# Patient Record
Sex: Female | Born: 1960 | Race: White | Hispanic: No | Marital: Married
Health system: Southern US, Community
[De-identification: ages and names within clinical notes are randomized; demographics above are authoritative.]

---

## 1997-10-28 ENCOUNTER — Ambulatory Visit (HOSPITAL_COMMUNITY): Admission: RE | Admit: 1997-10-28 | Discharge: 1997-10-28 | Payer: Self-pay | Admitting: Obstetrics & Gynecology

## 1999-12-11 ENCOUNTER — Other Ambulatory Visit: Admission: RE | Admit: 1999-12-11 | Discharge: 1999-12-11 | Payer: Self-pay | Admitting: Obstetrics & Gynecology

## 2000-09-29 ENCOUNTER — Encounter: Payer: Self-pay | Admitting: Emergency Medicine

## 2000-09-29 ENCOUNTER — Emergency Department (HOSPITAL_COMMUNITY): Admission: EM | Admit: 2000-09-29 | Discharge: 2000-09-30 | Payer: Self-pay | Admitting: Internal Medicine

## 2000-09-30 ENCOUNTER — Ambulatory Visit (HOSPITAL_BASED_OUTPATIENT_CLINIC_OR_DEPARTMENT_OTHER): Admission: RE | Admit: 2000-09-30 | Discharge: 2000-09-30 | Payer: Self-pay | Admitting: Orthopedic Surgery

## 2001-05-26 ENCOUNTER — Other Ambulatory Visit: Admission: RE | Admit: 2001-05-26 | Discharge: 2001-05-26 | Payer: Self-pay | Admitting: Obstetrics & Gynecology

## 2002-01-01 ENCOUNTER — Encounter: Payer: Self-pay | Admitting: Obstetrics & Gynecology

## 2002-01-01 ENCOUNTER — Encounter: Admission: RE | Admit: 2002-01-01 | Discharge: 2002-01-01 | Payer: Self-pay | Admitting: Obstetrics & Gynecology

## 2002-07-16 ENCOUNTER — Other Ambulatory Visit: Admission: RE | Admit: 2002-07-16 | Discharge: 2002-07-16 | Payer: Self-pay | Admitting: Obstetrics & Gynecology

## 2003-05-06 ENCOUNTER — Encounter: Admission: RE | Admit: 2003-05-06 | Discharge: 2003-05-06 | Payer: Self-pay | Admitting: Family Medicine

## 2004-02-24 ENCOUNTER — Other Ambulatory Visit: Admission: RE | Admit: 2004-02-24 | Discharge: 2004-02-24 | Payer: Self-pay | Admitting: Obstetrics & Gynecology

## 2004-04-22 HISTORY — PX: BREAST BIOPSY: SHX20

## 2004-05-25 ENCOUNTER — Encounter: Admission: RE | Admit: 2004-05-25 | Discharge: 2004-05-25 | Payer: Self-pay | Admitting: Obstetrics & Gynecology

## 2004-06-15 ENCOUNTER — Encounter: Admission: RE | Admit: 2004-06-15 | Discharge: 2004-06-15 | Payer: Self-pay | Admitting: Obstetrics & Gynecology

## 2004-06-20 ENCOUNTER — Encounter: Admission: RE | Admit: 2004-06-20 | Discharge: 2004-06-20 | Payer: Self-pay | Admitting: Obstetrics & Gynecology

## 2004-06-22 ENCOUNTER — Encounter: Admission: RE | Admit: 2004-06-22 | Discharge: 2004-06-22 | Payer: Self-pay | Admitting: Obstetrics & Gynecology

## 2004-07-06 ENCOUNTER — Encounter: Admission: RE | Admit: 2004-07-06 | Discharge: 2004-07-06 | Payer: Self-pay | Admitting: Obstetrics & Gynecology

## 2004-07-06 ENCOUNTER — Encounter (INDEPENDENT_AMBULATORY_CARE_PROVIDER_SITE_OTHER): Payer: Self-pay | Admitting: Specialist

## 2005-01-15 ENCOUNTER — Encounter: Admission: RE | Admit: 2005-01-15 | Discharge: 2005-01-15 | Payer: Self-pay | Admitting: Obstetrics & Gynecology

## 2005-08-16 ENCOUNTER — Encounter: Admission: RE | Admit: 2005-08-16 | Discharge: 2005-08-16 | Payer: Self-pay | Admitting: Obstetrics & Gynecology

## 2006-08-22 ENCOUNTER — Encounter: Admission: RE | Admit: 2006-08-22 | Discharge: 2006-08-22 | Payer: Self-pay | Admitting: Specialist

## 2007-10-27 ENCOUNTER — Encounter: Admission: RE | Admit: 2007-10-27 | Discharge: 2007-10-27 | Payer: Self-pay | Admitting: Obstetrics & Gynecology

## 2009-01-13 ENCOUNTER — Encounter: Admission: RE | Admit: 2009-01-13 | Discharge: 2009-01-13 | Payer: Self-pay | Admitting: Obstetrics & Gynecology

## 2010-02-05 ENCOUNTER — Encounter: Admission: RE | Admit: 2010-02-05 | Discharge: 2010-02-05 | Payer: Self-pay | Admitting: Obstetrics & Gynecology
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
## 2011-02-05 ENCOUNTER — Other Ambulatory Visit: Payer: Self-pay | Admitting: Obstetrics & Gynecology

## 2011-02-05 DIAGNOSIS — Z1231 Encounter for screening mammogram for malignant neoplasm of breast: Secondary | ICD-10-CM

## 2011-02-22 ENCOUNTER — Ambulatory Visit: Payer: Self-pay

## 2011-03-18 ENCOUNTER — Ambulatory Visit
Admission: RE | Admit: 2011-03-18 | Discharge: 2011-03-18 | Disposition: A | Discharge status: Home / Self Care | Payer: 59 | Source: Ambulatory Visit | Attending: Obstetrics & Gynecology | Admitting: Obstetrics & Gynecology

## 2011-03-18 DIAGNOSIS — Z1231 Encounter for screening mammogram for malignant neoplasm of breast: Secondary | ICD-10-CM

## 2012-02-18 ENCOUNTER — Other Ambulatory Visit: Payer: Self-pay | Admitting: Obstetrics & Gynecology

## 2012-02-18 DIAGNOSIS — Z1231 Encounter for screening mammogram for malignant neoplasm of breast: Secondary | ICD-10-CM

## 2012-03-30 ENCOUNTER — Ambulatory Visit
Admission: RE | Admit: 2012-03-30 | Discharge: 2012-03-30 | Disposition: A | Discharge status: Home / Self Care | Payer: 59 | Source: Ambulatory Visit | Attending: Obstetrics & Gynecology | Admitting: Obstetrics & Gynecology

## 2012-03-30 DIAGNOSIS — Z1231 Encounter for screening mammogram for malignant neoplasm of breast: Secondary | ICD-10-CM

## 2013-06-15 ENCOUNTER — Other Ambulatory Visit: Payer: Self-pay

## 2013-06-15 DIAGNOSIS — Z1231 Encounter for screening mammogram for malignant neoplasm of breast: Secondary | ICD-10-CM

## 2013-06-18 ENCOUNTER — Ambulatory Visit: Payer: 59

## 2013-08-02 ENCOUNTER — Ambulatory Visit
Admission: RE | Admit: 2013-08-02 | Discharge: 2013-08-02 | Disposition: A | Discharge status: Home / Self Care | Payer: 59 | Source: Ambulatory Visit

## 2013-08-02 DIAGNOSIS — Z1231 Encounter for screening mammogram for malignant neoplasm of breast: Secondary | ICD-10-CM

## 2014-01-05 ENCOUNTER — Other Ambulatory Visit: Payer: Self-pay | Admitting: Obstetrics & Gynecology

## 2014-01-06 LAB — CYTOLOGY - PAP

## 2015-09-27 ENCOUNTER — Other Ambulatory Visit: Payer: Self-pay | Admitting: Obstetrics & Gynecology

## 2015-09-27 DIAGNOSIS — Z1231 Encounter for screening mammogram for malignant neoplasm of breast: Secondary | ICD-10-CM

## 2015-10-09 ENCOUNTER — Ambulatory Visit
Admission: RE | Admit: 2015-10-09 | Discharge: 2015-10-09 | Disposition: A | Discharge status: Home / Self Care | Payer: 59 | Source: Ambulatory Visit | Attending: Obstetrics & Gynecology | Admitting: Obstetrics & Gynecology

## 2015-10-09 DIAGNOSIS — Z1231 Encounter for screening mammogram for malignant neoplasm of breast: Secondary | ICD-10-CM

## 2016-09-17 DIAGNOSIS — J0140 Acute pansinusitis, unspecified: Secondary | ICD-10-CM | POA: Diagnosis not present

## 2016-09-17 DIAGNOSIS — R05 Cough: Secondary | ICD-10-CM | POA: Diagnosis not present

## 2017-02-05 ENCOUNTER — Ambulatory Visit
Admission: RE | Admit: 2017-02-05 | Discharge: 2017-02-05 | Disposition: A | Discharge status: Home / Self Care | Payer: 59 | Source: Ambulatory Visit | Attending: Obstetrics & Gynecology | Admitting: Obstetrics & Gynecology

## 2017-02-05 ENCOUNTER — Other Ambulatory Visit: Payer: Self-pay | Admitting: Obstetrics & Gynecology

## 2017-02-05 DIAGNOSIS — Z1231 Encounter for screening mammogram for malignant neoplasm of breast: Secondary | ICD-10-CM

## 2017-02-12 DIAGNOSIS — Z01419 Encounter for gynecological examination (general) (routine) without abnormal findings: Secondary | ICD-10-CM | POA: Diagnosis not present

## 2017-07-09 DIAGNOSIS — K219 Gastro-esophageal reflux disease without esophagitis: Secondary | ICD-10-CM | POA: Diagnosis not present

## 2017-07-09 DIAGNOSIS — Z0000 Encounter for general adult medical examination without abnormal findings: Secondary | ICD-10-CM | POA: Diagnosis not present

## 2017-07-09 DIAGNOSIS — Z23 Encounter for immunization: Secondary | ICD-10-CM | POA: Diagnosis not present

## 2017-10-20 ENCOUNTER — Emergency Department (HOSPITAL_COMMUNITY)
Admission: EM | Admit: 2017-10-20 | Discharge: 2017-10-20 | Disposition: A | Discharge status: Home / Self Care | Payer: 59 | Attending: Emergency Medicine | Admitting: Emergency Medicine

## 2017-10-20 ENCOUNTER — Other Ambulatory Visit: Payer: Self-pay

## 2017-10-20 ENCOUNTER — Encounter (HOSPITAL_COMMUNITY): Payer: Self-pay

## 2017-10-20 ENCOUNTER — Emergency Department (HOSPITAL_COMMUNITY): Payer: 59

## 2017-10-20 DIAGNOSIS — K529 Noninfective gastroenteritis and colitis, unspecified: Secondary | ICD-10-CM | POA: Insufficient documentation

## 2017-10-20 DIAGNOSIS — K279 Peptic ulcer, site unspecified, unspecified as acute or chronic, without hemorrhage or perforation: Secondary | ICD-10-CM | POA: Insufficient documentation

## 2017-10-20 DIAGNOSIS — R109 Unspecified abdominal pain: Secondary | ICD-10-CM | POA: Diagnosis not present

## 2017-10-20 DIAGNOSIS — R1084 Generalized abdominal pain: Secondary | ICD-10-CM | POA: Diagnosis not present

## 2017-10-20 LAB — COMPREHENSIVE METABOLIC PANEL
ALBUMIN: 4.2 g/dL (ref 3.5–5.0)
ALK PHOS: 89 U/L (ref 38–126)
ALT: 35 U/L (ref 0–44)
ANION GAP: 8 (ref 5–15)
AST: 27 U/L (ref 15–41)
BILIRUBIN TOTAL: 0.7 mg/dL (ref 0.3–1.2)
BUN: 15 mg/dL (ref 6–20)
CALCIUM: 9.5 mg/dL (ref 8.9–10.3)
CO2: 27 mmol/L (ref 22–32)
Chloride: 106 mmol/L (ref 98–111)
Creatinine, Ser: 0.82 mg/dL (ref 0.44–1.00)
GFR calc non Af Amer: >60 mL/min (ref >60)
GLUCOSE: 142 mg/dL — AB (ref 70–99)
POTASSIUM: 4.4 mmol/L (ref 3.5–5.1)
Sodium: 141 mmol/L (ref 135–145)
TOTAL PROTEIN: 7.5 g/dL (ref 6.5–8.1)

## 2017-10-20 LAB — CBC
HCT: 44.9 % (ref 36.0–46.0)
HEMOGLOBIN: 14.8 g/dL (ref 12.0–15.0)
MCH: 30.5 pg (ref 26.0–34.0)
MCHC: 33 g/dL (ref 30.0–36.0)
MCV: 92.6 fL (ref 78.0–100.0)
Platelets: 341 10*3/uL (ref 150–400)
RBC: 4.85 MIL/uL (ref 3.87–5.11)
RDW: 12.6 % (ref 11.5–15.5)
WBC: 10.5 10*3/uL (ref 4.0–10.5)

## 2017-10-20 LAB — URINALYSIS, ROUTINE W REFLEX MICROSCOPIC
BILIRUBIN URINE: NEGATIVE
Bacteria, UA: NONE SEEN
Glucose, UA: NEGATIVE mg/dL
HGB URINE DIPSTICK: NEGATIVE
Ketones, ur: NEGATIVE mg/dL
NITRITE: NEGATIVE
PH: 6 (ref 5.0–8.0)
Protein, ur: NEGATIVE mg/dL
SPECIFIC GRAVITY, URINE: 1.019 (ref 1.005–1.030)

## 2017-10-20 LAB — I-STAT BETA HCG BLOOD, ED (MC, WL, AP ONLY): HCG, QUANTITATIVE: 5.7 m[IU]/mL — AB (ref <5)

## 2017-10-20 LAB — LIPASE, BLOOD: Lipase: 34 U/L (ref 11–51)

## 2017-10-20 MED ORDER — SODIUM CHLORIDE 0.9 % IV BOLUS
1000.0000 mL | Freq: Once | INTRAVENOUS | Status: AC
  Administered 2017-10-20: 1000 mL via INTRAVENOUS

## 2017-10-20 MED ORDER — GI COCKTAIL ~~LOC~~
30.0000 mL | Freq: Once | ORAL | Status: AC
Start: 2017-10-20 — End: 2017-10-20
  Administered 2017-10-20: 30 mL via ORAL
  Filled 2017-10-20: qty 30

## 2017-10-20 MED ORDER — ONDANSETRON 4 MG PO TBDP
4.0000 mg | ORAL_TABLET | Freq: Once | ORAL | Status: AC
  Administered 2017-10-20: 4 mg via ORAL
  Filled 2017-10-20: qty 1

## 2017-10-20 MED ORDER — HYDROCODONE-ACETAMINOPHEN 5-325 MG PO TABS
1.0000 | ORAL_TABLET | Freq: Once | ORAL | Status: AC
  Administered 2017-10-20: 1 via ORAL
  Filled 2017-10-20: qty 1

## 2017-10-20 MED ORDER — ACETAMINOPHEN ER 650 MG PO TBCR: 650.0000 mg | EXTENDED_RELEASE_TABLET | Freq: Three times a day (TID) | ORAL | 0 refills | Status: AC | PRN

## 2017-10-20 MED ORDER — HYDROMORPHONE HCL 1 MG/ML IJ SOLN
1.0000 mg | Freq: Once | INTRAMUSCULAR | Status: AC
  Administered 2017-10-20: 1 mg via INTRAVENOUS
  Filled 2017-10-20: qty 1

## 2017-10-20 MED ORDER — MORPHINE SULFATE (PF) 4 MG/ML IV SOLN
4.0000 mg | Freq: Once | INTRAVENOUS | Status: AC
  Administered 2017-10-20: 4 mg via INTRAVENOUS
  Filled 2017-10-20: qty 1

## 2017-10-20 MED ORDER — SUCRALFATE 1 GM/10ML PO SUSP: 1.0000 g | Freq: Three times a day (TID) | ORAL | 0 refills | Status: AC

## 2017-10-20 MED ORDER — ONDANSETRON HCL 4 MG/2ML IJ SOLN
4.0000 mg | Freq: Once | INTRAMUSCULAR | Status: AC
  Administered 2017-10-20: 4 mg via INTRAVENOUS
  Filled 2017-10-20: qty 2

## 2017-10-20 MED ORDER — IOPAMIDOL (ISOVUE-300) INJECTION 61%
INTRAVENOUS | Status: AC
  Filled 2017-10-20: qty 100

## 2017-10-20 MED ORDER — ONDANSETRON 4 MG PO TBDP: 4.0000 mg | ORAL_TABLET | Freq: Three times a day (TID) | ORAL | 0 refills | Status: AC | PRN

## 2017-10-20 MED ORDER — IOPAMIDOL (ISOVUE-300) INJECTION 61%
100.0000 mL | Freq: Once | INTRAVENOUS | Status: AC | PRN
  Administered 2017-10-20: 100 mL via INTRAVENOUS

## 2017-10-20 NOTE — Discharge Instructions (Signed)
We saw in the ER for your abdominal pain of sudden onset. CT scan shows that you have inflammation of the esophagus, stomach and the small intestines. At this time we do not think underlying process is infectious, therefore we are not giving any antibiotics.  Treatment will be supportive, with symptom control.  Please return to the ER if you start having fevers, bloody stool or vomiting, or if the pain gets unbearable, or if you are unable to tolerate any fluids.  See your primary care doctor or the GI doctor otherwise in 5 to 7 days.
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
## 2017-10-20 NOTE — ED Provider Notes (Signed)
[facility]-EMERGENCY DEPT Provider Note   CSN: [id number] Arrival date & time: 10/20/17  0028     History   Chief Complaint Chief Complaint  Patient presents with  . Abdominal Pain  . Abdominal Cramping    HPI Theresa Walsh is a 57 y.o. female.  HPI 57 year old comes in with chief complaint of sudden onset abdominal pain. Patient has no significant medical or surgical history.  She states that she started having sudden onset abdominal pain this evening.  Pain is generalized, located over the midline -and the pain radiates from lower quadrants to the upper quadrants.. Pain does not radiate to the back.  Patient denies any history of similar pain in the past.  She has no dysuria, urinary frequency, blood in the urine, vaginal discharge or bleeding.  Patient denies any history of pelvic disorders or kidney stones.  She does indicate that she has hiatal hernia, however it has not caused her to have any problems thus far.  No travel history or suspicious food intake.   History reviewed. No pertinent past medical history.  There are no active problems to display for this patient.   OB History   None      Home Medications    Prior to Admission medications   Medication Sig Start Date End Date Taking? Authorizing Provider  acetaminophen (TYLENOL 8 HOUR) 650 MG CR tablet Take 1 tablet (650 mg total) by mouth every 8 (eight) hours as needed. 10/20/17   Derwood Kaplan, MD  ondansetron (ZOFRAN ODT) 4 MG disintegrating tablet Take 1 tablet (4 mg total) by mouth every 8 (eight) hours as needed for nausea or vomiting. 10/20/17   Derwood Kaplan, MD  sucralfate (CARAFATE) 1 GM/10ML suspension Take 10 mLs (1 g total) by mouth 4 (four) times daily -  with meals and at bedtime. 10/20/17   Derwood Kaplan, MD    Family History History reviewed. No pertinent family history.  Social History Social History   Tobacco Use  . Smoking status: Not on file  Substance Use  Topics  . Alcohol use: Not on file  . Drug use: Not on file     Allergies   Patient has no known allergies.   Review of Systems Review of Systems  Constitutional: Positive for activity change.  Respiratory: Negative for shortness of breath.   Cardiovascular: Negative for chest pain.  Gastrointestinal: Positive for abdominal pain and nausea. Negative for diarrhea.  Genitourinary: Negative for dysuria, flank pain, vaginal bleeding and vaginal discharge.  Allergic/Immunologic: Negative for immunocompromised state.  Hematological: Does not bruise/bleed easily.  All other systems reviewed and are negative.    Physical Exam Updated Vital Signs BP (!) 113/59   Pulse 61   Temp 97.7 F (36.5 C) (Oral)   Resp 16   SpO2 93%   Physical Exam  Constitutional: She is oriented to person, place, and time. She appears well-developed.  HENT:  Head: Normocephalic and atraumatic.  Eyes: Pupils are equal, round, and reactive to light. Conjunctivae and EOM are normal.  Neck: Normal range of motion. Neck supple.  Cardiovascular: Normal rate, regular rhythm and normal heart sounds.  Pulmonary/Chest: Effort normal and breath sounds normal. No respiratory distress.  Abdominal: Soft. Bowel sounds are normal. She exhibits no distension. There is generalized tenderness and tenderness in the epigastric area, periumbilical area and suprapubic area. There is no rebound and no guarding. No hernia.  Neurological: She is alert and oriented to person, place, and time.  Skin:  Skin is warm and dry.  Nursing note and vitals reviewed.    ED Treatments / Results  Labs (all labs ordered are listed, but only abnormal results are displayed) Labs Reviewed  COMPREHENSIVE METABOLIC PANEL - Abnormal; Notable for the following components:      Result Value   Glucose, Bld 142 (*)    All other components within normal limits  URINALYSIS, ROUTINE W REFLEX MICROSCOPIC - Abnormal; Notable for the following  components:   Leukocytes, UA SMALL (*)    All other components within normal limits  I-STAT BETA HCG BLOOD, ED ([facility], [facility], [facility] ONLY) - Abnormal; Notable for the following components:   I-stat hCG, quantitative 5.7 (*)    All other components within normal limits  LIPASE, BLOOD  CBC    EKG None  Radiology Ct Abdomen Pelvis W Contrast  Result Date: 10/20/2017 CLINICAL DATA:  57 year old female with abdominal pain. EXAM: CT ABDOMEN AND PELVIS WITH CONTRAST TECHNIQUE: Multidetector CT imaging of the abdomen and pelvis was performed using the standard protocol following bolus administration of intravenous contrast. CONTRAST:  100mL ISOVUE-300 IOPAMIDOL (ISOVUE-300) INJECTION 61% COMPARISON:  None. FINDINGS: Lower chest: The visualized lung bases are clear. No intra-abdominal free air. There is a small free fluid within pelvis as well as small perisplenic fluid. Hepatobiliary: No focal liver abnormality is seen. No gallstones, gallbladder wall thickening, or biliary dilatation. Pancreas: Unremarkable. No pancreatic ductal dilatation or surrounding inflammatory changes. Spleen: Normal in size without focal abnormality. Adrenals/Urinary Tract: The adrenal glands are unremarkable. There is a 12 mm right renal cyst. There is no hydronephrosis on either side. There is symmetric enhancement and excretion of contrast by both kidneys. The visualized ureters and urinary bladder appear unremarkable. Stomach/Bowel: The stomach is distended with liquid content. There is slight thickened appearance of the distal esophagus which may be related to underdistention or represent esophagitis related to chronic reflux. There are multiple thickened and inflamed loops of small bowel in the mid abdomen consistent with enteritis. There is associated narrowing of a long segment of small bowel loop in the mid abdomen secondary to inflammatory changes and thickening of the wall without evidence of obstruction at this time. There is loose  stool in the proximal colon and formed stool distally. Multiple small scattered colonic diverticula without active inflammatory changes. The appendix is normal. Vascular/Lymphatic: No significant vascular findings are present. No enlarged abdominal or pelvic lymph nodes. Reproductive: The uterus and ovaries are grossly unremarkable. Other: None Musculoskeletal: No acute or significant osseous findings. IMPRESSION: 1. Long segment enteritis. No definite evidence of bowel obstruction at this time. Normal appendix. 2. Thickened visualized distal esophagus may be related to underdistention or represent esophagitis secondary to reflux. Electronically Signed   By: Elgie CollardArash  Radparvar M.D.   On: 10/20/2017 03:20    Procedures Procedures (including critical care time)  Medications Ordered in ED Medications  iopamidol (ISOVUE-300) 61 % injection (has no administration in time range)  HYDROcodone-acetaminophen (NORCO/VICODIN) 5-325 MG per tablet 1 tablet (has no administration in time range)  gi cocktail (Maalox,Lidocaine,Donnatal) (has no administration in time range)  HYDROmorphone (DILAUDID) injection 1 mg (1 mg Intravenous Given 10/20/17 0230)  ondansetron (ZOFRAN) injection 4 mg (4 mg Intravenous Given 10/20/17 0230)  sodium chloride 0.9 % bolus 1,000 mL (0 mLs Intravenous Stopped 10/20/17 0356)  iopamidol (ISOVUE-300) 61 % injection 100 mL (100 mLs Intravenous Contrast Given 10/20/17 0257)  morphine 4 MG/ML injection 4 mg (4 mg Intravenous Given 10/20/17 0359)  ondansetron (ZOFRAN-ODT) disintegrating  tablet 4 mg (4 mg Oral Given 10/20/17 0358)     Initial Impression / Assessment and Plan / ED Course  I have reviewed the triage vital signs and the nursing notes.  Pertinent labs & imaging results that were available during my care of the patient were reviewed by me and considered in my medical decision making (see chart for details).  Clinical Course as of Oct 20 424  Mon Oct 20, 2017  0425 Results from the  ER workup discussed with the patient face to face and all questions answered to the best of my ability.  Patient is feeling a lot better after she received morphine.  However her pain is slowly coming back.  We will give her another round of pain medication prior to discharge.  Labs are also reassuring.  Strict ER return precautions have been discussed, and patient is agreeing with the plan and is comfortable with the workup done and the recommendations from the ER.   CT ABDOMEN PELVIS W CONTRAST [AN]    Clinical Course User Index [AN] Derwood Kaplan, MD    57 year old female comes in with chief complaint of abdominal pain. Patient has sudden onset, severe abdominal pain that is described as sharp and colicky nature.  On exam patient does not have any focal tenderness.  Differential diagnosis includes perforation, small bowel obstruction, colitis.  Ovarian torsion considered in the differential as well, however deemed highly unlikely given that patient does not have any back pain.  CT scan abdomen and pelvis ordered.  Final Clinical Impressions(s) / ED Diagnoses   Final diagnoses:  Enteritis  PUD (peptic ulcer disease)    ED Discharge Orders        Ordered    acetaminophen (TYLENOL 8 HOUR) 650 MG CR tablet  Every 8 hours PRN     10/20/17 0419    sucralfate (CARAFATE) 1 GM/10ML suspension  3 times daily with meals & bedtime     10/20/17 0419    ondansetron (ZOFRAN ODT) 4 MG disintegrating tablet  Every 8 hours PRN     10/20/17 0419       Derwood Kaplan, MD 10/20/17 [phone]
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
## 2017-10-20 NOTE — ED Triage Notes (Signed)
Pt reports severe abdominal cramping that started at 830p. She reports that she has vomiting and is nauseous. She denies diarrhea. A&Ox4. Denies history of same.

## 2017-11-19 DIAGNOSIS — Z807 Family history of other malignant neoplasms of lymphoid, hematopoietic and related tissues: Secondary | ICD-10-CM | POA: Diagnosis not present

## 2017-11-19 DIAGNOSIS — K529 Noninfective gastroenteritis and colitis, unspecified: Secondary | ICD-10-CM | POA: Diagnosis not present

## 2017-11-19 DIAGNOSIS — R933 Abnormal findings on diagnostic imaging of other parts of digestive tract: Secondary | ICD-10-CM | POA: Diagnosis not present

## 2017-11-24 ENCOUNTER — Other Ambulatory Visit: Payer: Self-pay | Admitting: Gastroenterology

## 2017-11-24 DIAGNOSIS — K529 Noninfective gastroenteritis and colitis, unspecified: Secondary | ICD-10-CM

## 2017-12-08 ENCOUNTER — Ambulatory Visit
Admission: RE | Admit: 2017-12-08 | Discharge: 2017-12-08 | Disposition: A | Discharge status: Home / Self Care | Payer: 59 | Source: Ambulatory Visit | Attending: Gastroenterology | Admitting: Gastroenterology

## 2017-12-08 DIAGNOSIS — K529 Noninfective gastroenteritis and colitis, unspecified: Secondary | ICD-10-CM

## 2017-12-08 DIAGNOSIS — N281 Cyst of kidney, acquired: Secondary | ICD-10-CM | POA: Diagnosis not present

## 2017-12-08 MED ORDER — IOPAMIDOL (ISOVUE-300) INJECTION 61%
100.0000 mL | Freq: Once | INTRAVENOUS | Status: AC | PRN
  Administered 2017-12-08: 100 mL via INTRAVENOUS

## 2017-12-10 DIAGNOSIS — K2930 Chronic superficial gastritis without bleeding: Secondary | ICD-10-CM | POA: Diagnosis not present

## 2017-12-10 DIAGNOSIS — K317 Polyp of stomach and duodenum: Secondary | ICD-10-CM | POA: Diagnosis not present

## 2017-12-10 DIAGNOSIS — K210 Gastro-esophageal reflux disease with esophagitis: Secondary | ICD-10-CM | POA: Diagnosis not present

## 2018-01-02 ENCOUNTER — Other Ambulatory Visit: Payer: Self-pay | Admitting: Obstetrics & Gynecology

## 2018-01-02 DIAGNOSIS — Z1231 Encounter for screening mammogram for malignant neoplasm of breast: Secondary | ICD-10-CM

## 2018-02-06 ENCOUNTER — Ambulatory Visit
Admission: RE | Admit: 2018-02-06 | Discharge: 2018-02-06 | Disposition: A | Discharge status: Home / Self Care | Payer: 59 | Source: Ambulatory Visit | Attending: Obstetrics & Gynecology | Admitting: Obstetrics & Gynecology

## 2018-02-06 DIAGNOSIS — Z1231 Encounter for screening mammogram for malignant neoplasm of breast: Secondary | ICD-10-CM

## 2019-01-27 ENCOUNTER — Other Ambulatory Visit: Payer: Self-pay | Admitting: Obstetrics & Gynecology

## 2019-01-27 DIAGNOSIS — Z1231 Encounter for screening mammogram for malignant neoplasm of breast: Secondary | ICD-10-CM

## 2019-03-12 ENCOUNTER — Ambulatory Visit
Admission: RE | Admit: 2019-03-12 | Discharge: 2019-03-12 | Disposition: A | Discharge status: Home / Self Care | Payer: 59 | Source: Ambulatory Visit | Attending: Obstetrics & Gynecology | Admitting: Obstetrics & Gynecology

## 2019-03-12 ENCOUNTER — Other Ambulatory Visit: Payer: Self-pay

## 2019-03-12 DIAGNOSIS — Z1231 Encounter for screening mammogram for malignant neoplasm of breast: Secondary | ICD-10-CM

## 2019-03-16 ENCOUNTER — Other Ambulatory Visit: Payer: Self-pay | Admitting: Obstetrics & Gynecology

## 2019-03-16 DIAGNOSIS — R928 Other abnormal and inconclusive findings on diagnostic imaging of breast: Secondary | ICD-10-CM

## 2019-03-23 ENCOUNTER — Other Ambulatory Visit: Payer: 59

## 2019-03-26 ENCOUNTER — Ambulatory Visit: Payer: 59

## 2019-03-26 ENCOUNTER — Ambulatory Visit
Admission: RE | Admit: 2019-03-26 | Discharge: 2019-03-26 | Disposition: A | Discharge status: Home / Self Care | Payer: 59 | Source: Ambulatory Visit | Attending: Obstetrics & Gynecology | Admitting: Obstetrics & Gynecology

## 2019-03-26 ENCOUNTER — Other Ambulatory Visit: Payer: Self-pay

## 2019-03-26 DIAGNOSIS — R928 Other abnormal and inconclusive findings on diagnostic imaging of breast: Secondary | ICD-10-CM

## 2020-05-04 ENCOUNTER — Other Ambulatory Visit: Payer: Self-pay | Admitting: Obstetrics & Gynecology

## 2020-05-04 DIAGNOSIS — Z1231 Encounter for screening mammogram for malignant neoplasm of breast: Secondary | ICD-10-CM

## 2020-06-16 ENCOUNTER — Other Ambulatory Visit: Payer: Self-pay

## 2020-06-16 ENCOUNTER — Ambulatory Visit
Admission: RE | Admit: 2020-06-16 | Discharge: 2020-06-16 | Disposition: A | Discharge status: Home / Self Care | Payer: 59 | Source: Ambulatory Visit | Attending: Obstetrics & Gynecology | Admitting: Obstetrics & Gynecology

## 2020-06-16 DIAGNOSIS — Z1231 Encounter for screening mammogram for malignant neoplasm of breast: Secondary | ICD-10-CM

## 2021-03-01 IMAGING — MG DIGITAL SCREENING BILAT W/ TOMO W/ CAD
6 of 10 series · 6 of 30 positions shown · non-contrast
Comparison: Previous exam(s).

CLINICAL DATA: Screening.

EXAM:
DIGITAL SCREENING BILATERAL MAMMOGRAM WITH TOMO AND CAD

[L CC synth-2D]
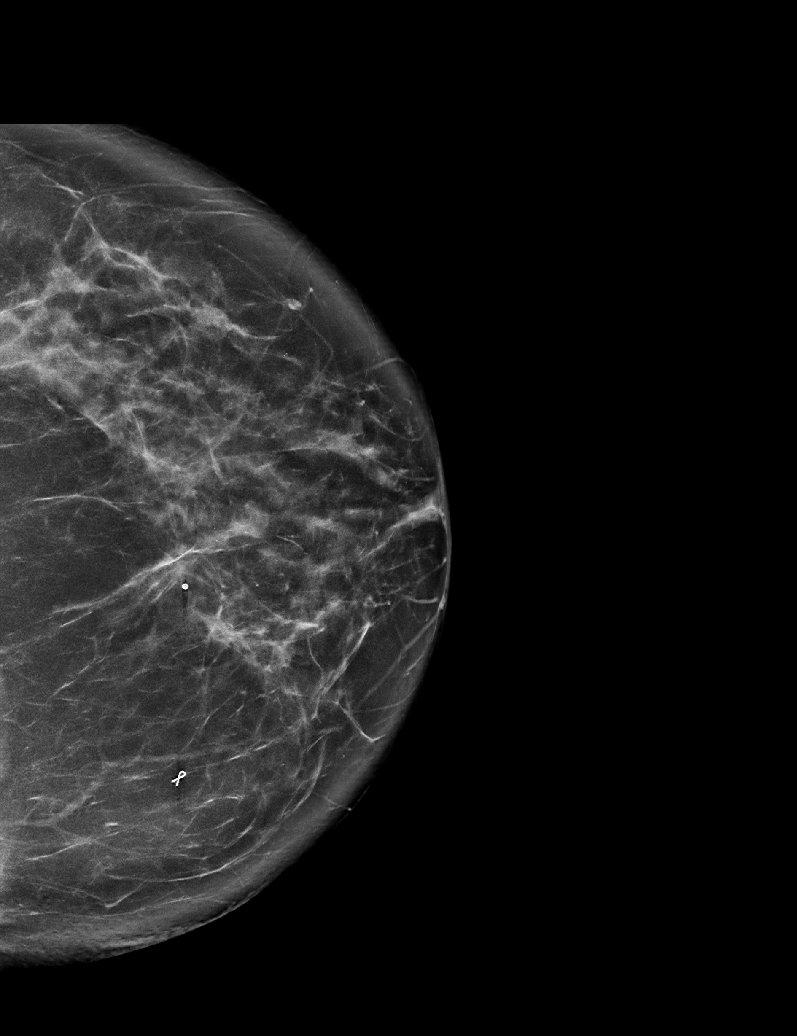

[R CC synth-2D]
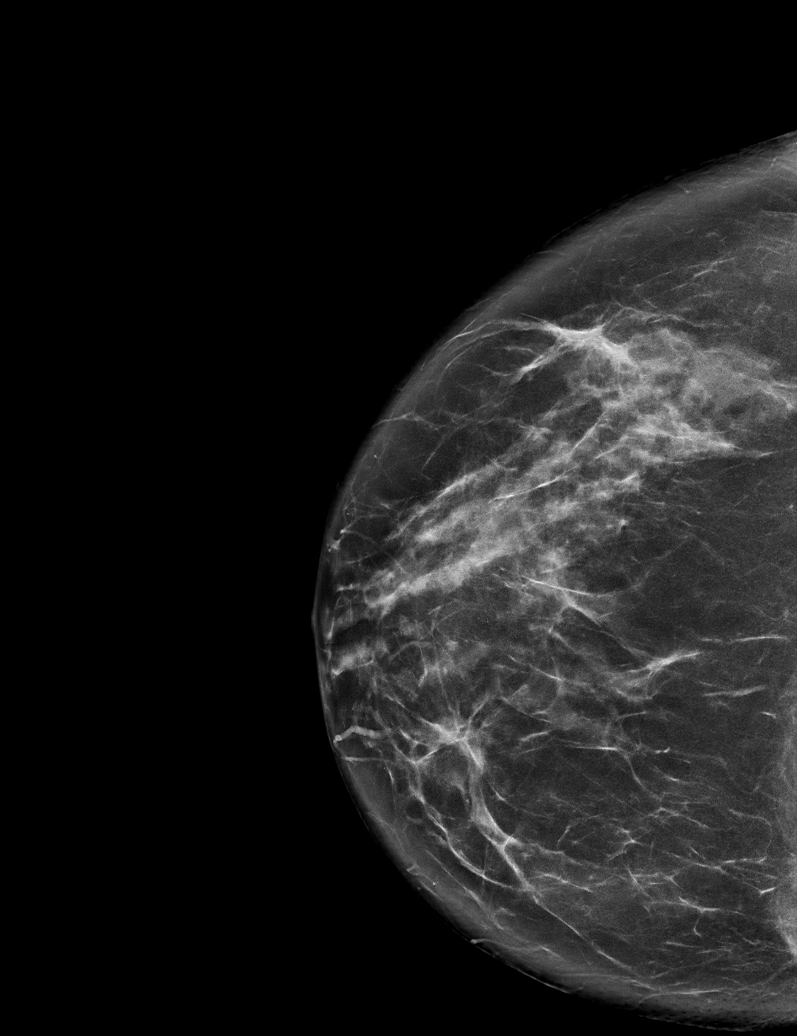

[R MLO synth-2D]
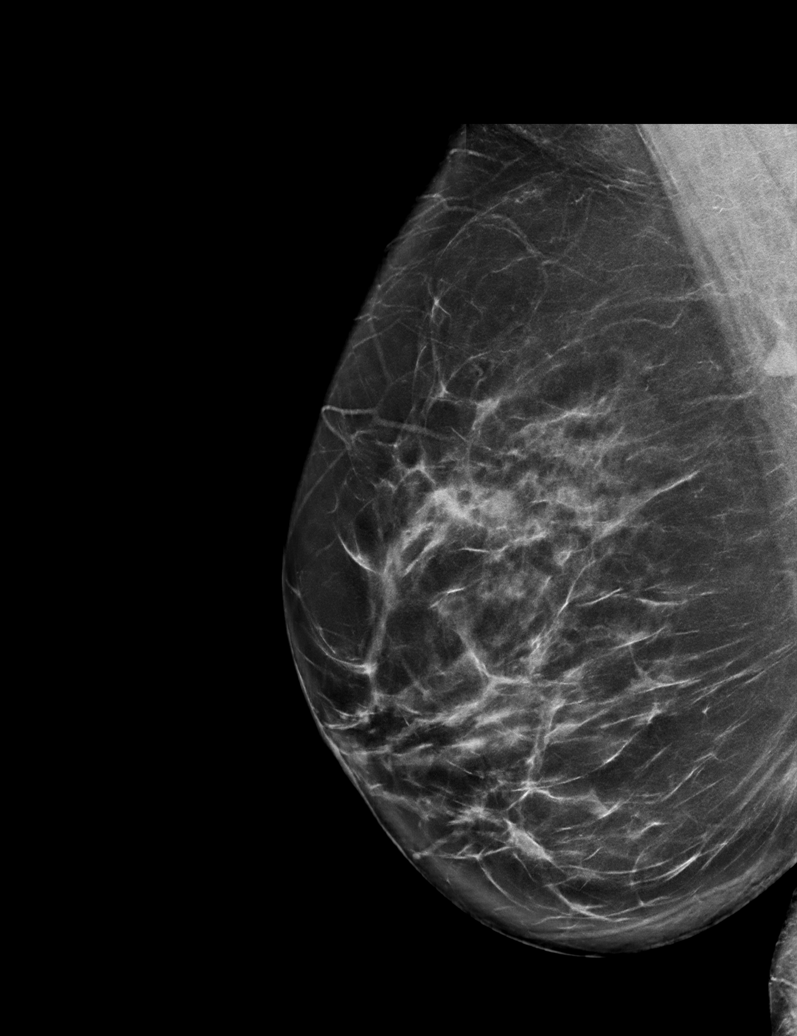

[L XCCL synth-2D]
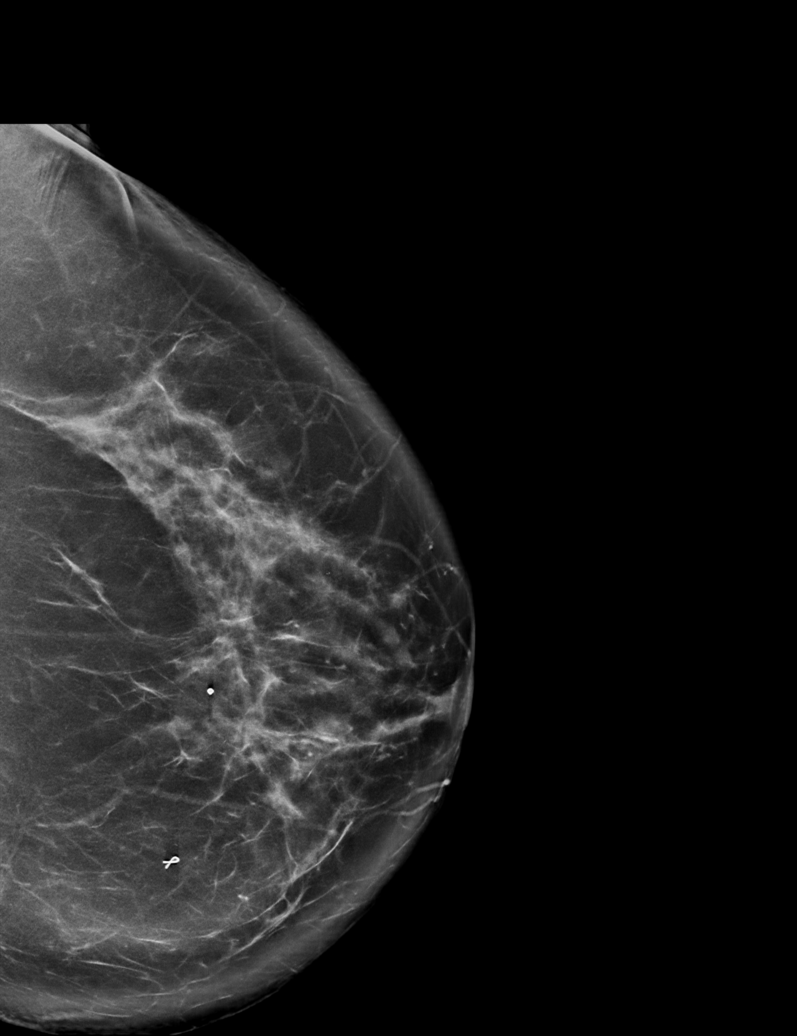

[L MLO synth-2D]
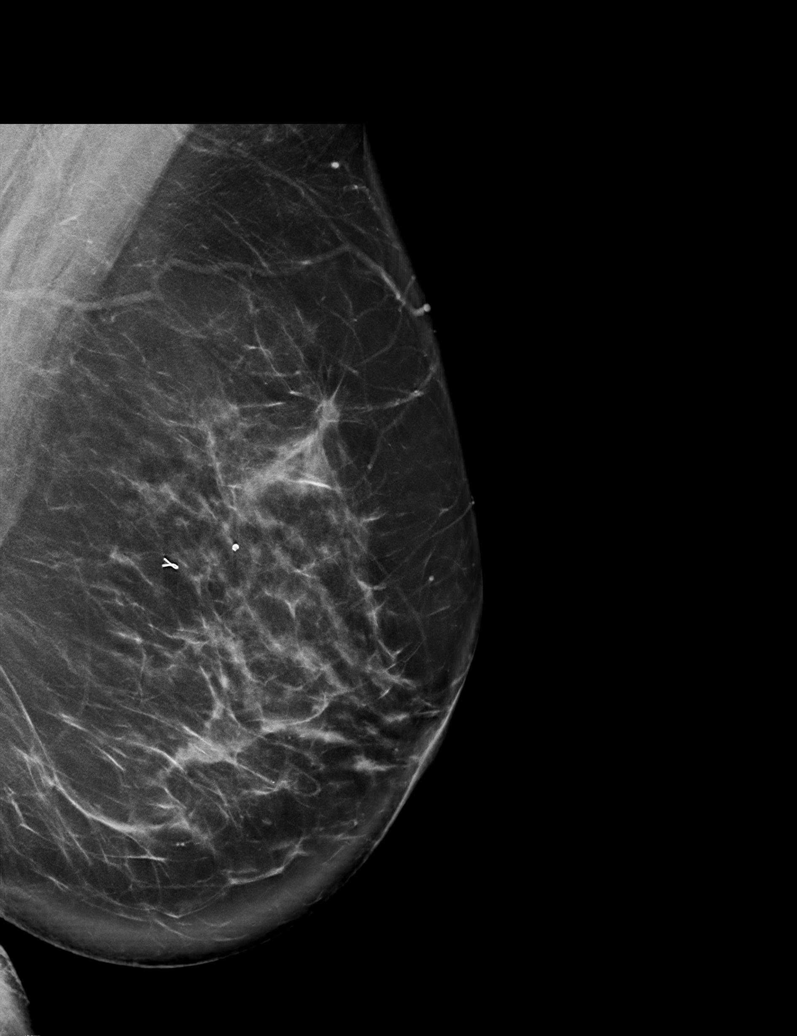

[L CC tomo · tomo slice 40/79.0]
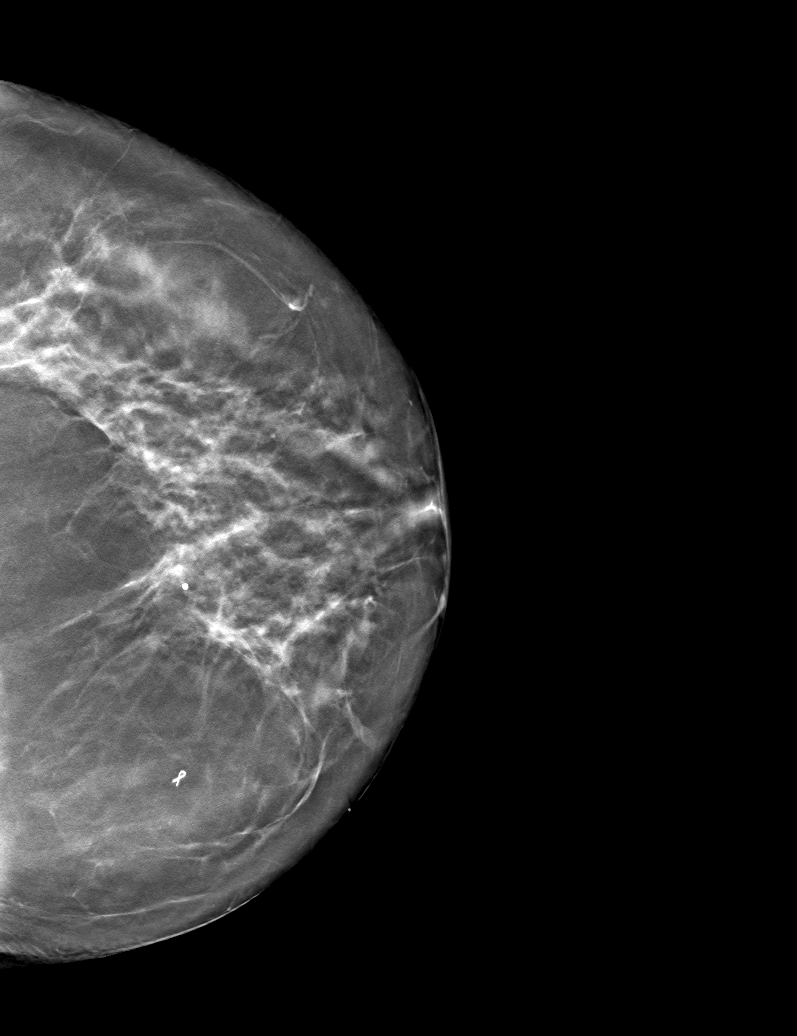

[6 of 30 positions shown; findings below may reference images not displayed]

ACR Breast Density Category c: The breast tissue is heterogeneously
dense, which may obscure small masses.
FINDINGS: In the left breast, a possible asymmetry warrants further
evaluation. In the right breast, no findings suspicious for
malignancy. Images were processed with CAD.
IMPRESSION: Further evaluation is suggested for possible asymmetry in the left
breast.

RECOMMENDATION:
Diagnostic mammogram and possibly ultrasound of the left breast.
(Code:[id number])

The patient will be contacted regarding the findings, and additional
imaging will be scheduled.

BI-RADS CATEGORY  0: Incomplete. Need additional imaging evaluation
and/or prior mammograms for comparison.

## 2021-05-10 ENCOUNTER — Other Ambulatory Visit: Payer: Self-pay | Admitting: Obstetrics & Gynecology

## 2021-05-10 DIAGNOSIS — Z1231 Encounter for screening mammogram for malignant neoplasm of breast: Secondary | ICD-10-CM

## 2021-07-13 ENCOUNTER — Ambulatory Visit: Payer: 59

## 2021-07-20 ENCOUNTER — Ambulatory Visit
Admission: RE | Admit: 2021-07-20 | Discharge: 2021-07-20 | Disposition: A | Discharge status: Home / Self Care | Payer: 59 | Source: Ambulatory Visit | Attending: Obstetrics & Gynecology | Admitting: Obstetrics & Gynecology

## 2021-07-20 DIAGNOSIS — Z1231 Encounter for screening mammogram for malignant neoplasm of breast: Secondary | ICD-10-CM

## 2022-06-14 ENCOUNTER — Other Ambulatory Visit: Payer: Self-pay | Admitting: Family Medicine

## 2022-06-14 DIAGNOSIS — Z1231 Encounter for screening mammogram for malignant neoplasm of breast: Secondary | ICD-10-CM

## 2022-07-26 ENCOUNTER — Ambulatory Visit
Admission: RE | Admit: 2022-07-26 | Discharge: 2022-07-26 | Disposition: A | Discharge status: Home / Self Care | Payer: 59 | Source: Ambulatory Visit | Attending: Family Medicine | Admitting: Family Medicine

## 2022-07-26 DIAGNOSIS — Z1231 Encounter for screening mammogram for malignant neoplasm of breast: Secondary | ICD-10-CM

## 2023-08-01 ENCOUNTER — Other Ambulatory Visit: Payer: Self-pay | Admitting: Family Medicine

## 2023-08-01 ENCOUNTER — Ambulatory Visit
Admission: RE | Admit: 2023-08-01 | Discharge: 2023-08-01 | Disposition: A | Discharge status: Home / Self Care | Source: Ambulatory Visit | Attending: Family Medicine | Admitting: Family Medicine

## 2023-08-01 DIAGNOSIS — Z1231 Encounter for screening mammogram for malignant neoplasm of breast: Secondary | ICD-10-CM
